# Patient Record
Sex: Male | Born: 1995 | Race: White | Hispanic: No | Smoking: Never smoker
Health system: Southern US, Community
[De-identification: ages and names within clinical notes are randomized; demographics above are authoritative.]

---
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
## 2016-02-02 ENCOUNTER — Ambulatory Visit (INDEPENDENT_AMBULATORY_CARE_PROVIDER_SITE_OTHER): Payer: Commercial Managed Care - HMO | Admitting: Family Medicine

## 2016-02-02 VITALS — BP 132/86 | HR 102 | Temp 102.6°F | Ht 73.0 in | Wt 315.0 lb

## 2016-02-02 DIAGNOSIS — R059 Cough, unspecified: Secondary | ICD-10-CM

## 2016-02-02 DIAGNOSIS — J101 Influenza due to other identified influenza virus with other respiratory manifestations: Secondary | ICD-10-CM | POA: Diagnosis not present

## 2016-02-02 DIAGNOSIS — R05 Cough: Secondary | ICD-10-CM | POA: Diagnosis not present

## 2016-02-02 MED ORDER — OSELTAMIVIR PHOSPHATE 75 MG PO CAPS: 75.0000 mg | ORAL_CAPSULE | Freq: Two times a day (BID) | ORAL | Status: DC

## 2016-02-02 MED ORDER — GUAIFENESIN-CODEINE 100-10 MG/5ML PO SYRP: 5.0000 mL | ORAL_SOLUTION | ORAL | Status: DC | PRN

## 2016-02-02 NOTE — Progress Notes (Signed)
Subjective:  Patient ID: Austin Ford, male    DOB: 05/31/1996  Age: 20 y.o. MRN: [id number]  CC: Abdominal Cramping   HPI Austin Ford presents for  Patient presents with severe paroxysms of dry cough and runny stuffy nose. Diffuse headache of moderate intensity. Patient also has chills and subjective fever. Body aches worst in the back but present in the legs, shoulders, and torso as well. Onset 2 days ago. Also having some nausea with the above symptoms. Sister diagnosed with influenza B    History Austin Ford has no past medical history on file.   He has no past surgical history on file.   His family history includes Breast cancer in his maternal grandmother; Heart disease in his paternal grandmother; Hyperlipidemia in his paternal grandfather; Hypertension in his father, mother, and paternal grandmother; Lymphoma in his paternal grandfather.He reports that he has never smoked. He has never used smokeless tobacco. He reports that he does not drink alcohol or use illicit drugs.    ROS Review of Systems  Constitutional: Positive for fever, chills, activity change and appetite change.  HENT: Negative for congestion, ear discharge, ear pain, hearing loss, nosebleeds, postnasal drip, rhinorrhea, sinus pressure, sneezing and trouble swallowing.   Respiratory: Positive for cough. Negative for chest tightness and shortness of breath.   Cardiovascular: Negative for chest pain and palpitations.  Musculoskeletal: Positive for myalgias.  Skin: Negative for color change and rash.    Objective:  BP 132/86 mmHg  Pulse 102  Temp(Src) 102.6 F (39.2 C) (Oral)  Ht 6\' 1"  (1.854 m)  Wt 315 lb (142.883 kg)  BMI 41.57 kg/m2  BP Readings from Last 3 Encounters:  02/02/16 132/86  04/04/15 138/78  03/28/15 146/92    Wt Readings from Last 3 Encounters:  02/02/16 315 lb (142.883 kg) (100 %*, Z = 3.18)  04/04/15 295 lb 8 oz (134.038 kg) (100 %*, Z = 3.05)  03/28/15 299 lb 9.6 oz (135.898 kg) (100  %*, Z = 3.09)   * Growth percentiles are based on CDC 2-20 Years data.     Physical Exam  Constitutional: He is oriented to person, place, and time. He appears well-developed and well-nourished.  HENT:  Head: Normocephalic and atraumatic.  Right Ear: Tympanic membrane and external ear normal. No decreased hearing is noted.  Left Ear: Tympanic membrane and external ear normal. No decreased hearing is noted.  Nose: Mucosal edema present. Right sinus exhibits no frontal sinus tenderness. Left sinus exhibits no frontal sinus tenderness.  Mouth/Throat: No oropharyngeal exudate or posterior oropharyngeal erythema.  Eyes: EOM are normal. Pupils are equal, round, and reactive to light.  Neck: No Brudzinski's sign noted.  Pulmonary/Chest: Breath sounds normal. No respiratory distress. He has no wheezes. He has no rales.  Abdominal: Soft. There is no tenderness.  Lymphadenopathy:       Head (right side): No preauricular adenopathy present.       Head (left side): No preauricular adenopathy present.       Right cervical: No superficial cervical adenopathy present.      Left cervical: No superficial cervical adenopathy present.  Neurological: He is alert and oriented to person, place, and time.  Skin: Skin is warm and dry.     No results found for: WBC, HGB, HCT, PLT, GLUCOSE, CHOL, TRIG, HDL, LDLDIRECT, LDLCALC, ALT, AST, NA, K, CL, CREATININE, BUN, CO2, TSH, PSA, INR, GLUF, HGBA1C, MICROALBUR  Patient was never admitted.  Assessment & Plan:   Austin Ford was seen  today for abdominal cramping.  Diagnoses and all orders for this visit:  Cough -     Veritor Flu A/B Waived  Other orders -     oseltamivir (TAMIFLU) 75 MG capsule; Take 1 capsule (75 mg total) by mouth 2 (two) times daily. -     guaiFENesin-codeine (CHERATUSSIN AC) 100-10 MG/5ML syrup; Take 5 mLs by mouth every 4 (four) hours as needed for cough.  Flu test positive for influenza B  I have discontinued Austin Ford  nystatin-triamcinolone and HYDROcodone-acetaminophen. I am also having him start on oseltamivir and guaiFENesin-codeine.  Meds ordered this encounter  Medications  . oseltamivir (TAMIFLU) 75 MG capsule    Sig: Take 1 capsule (75 mg total) by mouth 2 (two) times daily.    Dispense:  10 capsule    Refill:  0  . guaiFENesin-codeine (CHERATUSSIN AC) 100-10 MG/5ML syrup    Sig: Take 5 mLs by mouth every 4 (four) hours as needed for cough.    Dispense:  180 mL    Refill:  0     Follow-up: No Follow-up on file.  Mechele Claude, M.D.

## 2016-02-03 LAB — VERITOR FLU A/B WAIVED
Influenza A: NEGATIVE
Influenza B: POSITIVE — AB

## 2016-02-21 ENCOUNTER — Encounter: Payer: Self-pay | Admitting: Nurse Practitioner

## 2016-02-21 ENCOUNTER — Ambulatory Visit (INDEPENDENT_AMBULATORY_CARE_PROVIDER_SITE_OTHER): Payer: Commercial Managed Care - HMO | Admitting: Nurse Practitioner

## 2016-02-21 VITALS — BP 132/84 | HR 67 | Temp 97.8°F | Ht 73.0 in | Wt 308.6 lb

## 2016-02-21 DIAGNOSIS — R0602 Shortness of breath: Secondary | ICD-10-CM

## 2016-02-21 DIAGNOSIS — Z0000 Encounter for general adult medical examination without abnormal findings: Secondary | ICD-10-CM | POA: Diagnosis not present

## 2016-02-21 NOTE — Patient Instructions (Signed)

## 2016-02-21 NOTE — Progress Notes (Signed)
   Subjective:    Patient ID: Austin Ford, male    DOB: 1995/11/29, 20 y.o.   MRN: [id number]    Pt in today for CPE and has complaint of some shortness of breath. Has never been diagnosed with asthma or had any problems with SOB before. Had the flu in March along with cough and some hoarseness and does not know if it is related. Is currently taking Zyrtec, Mucinex, and Ibuprofen.   Shortness of Breath This is a recurrent problem. The current episode started more than 1 month ago. The problem occurs intermittently. The problem has been waxing and waning. Associated symptoms include a sore throat. Nothing aggravates the symptoms. The patient has no known risk factors for DVT/PE. His past medical history is significant for allergies.  Knee Pain  The incident occurred more than 1 week ago. The incident occurred at home. The injury mechanism was a twisting injury. The pain is present in the left knee. The patient is experiencing no pain. The pain has been intermittent since onset. He reports no foreign bodies present. The symptoms are aggravated by weight bearing. He has tried NSAIDs for the symptoms. The treatment provided significant relief.    Review of Systems  Constitutional: Negative.   HENT: Positive for congestion, postnasal drip, sinus pressure, sore throat and voice change.   Eyes: Negative.   Respiratory: Positive for shortness of breath.   Cardiovascular: Negative.   Gastrointestinal: Negative.   Endocrine: Negative.   Genitourinary: Negative.   Musculoskeletal: Negative.   Skin: Negative.   Allergic/Immunologic: Positive for environmental allergies.  Neurological: Negative.   Hematological: Negative.   Psychiatric/Behavioral: Negative.        Objective:   Physical Exam  Constitutional: He is oriented to person, place, and time. Vital signs are normal. He appears well-developed and well-nourished.  HENT:  Right Ear: Hearing, tympanic membrane, external ear and ear canal  normal.  Left Ear: Hearing, tympanic membrane, external ear and ear canal normal.  Nose: Nose normal.  Mouth/Throat: Uvula is midline, oropharynx is clear and moist and mucous membranes are normal.  Enlarged tonsils  Eyes: Conjunctivae and EOM are normal. Pupils are equal, round, and reactive to light.  Neck: Trachea normal and normal range of motion. Neck supple.  Cardiovascular: Normal rate, regular rhythm, normal heart sounds and intact distal pulses.   Pulmonary/Chest: Effort normal and breath sounds normal.  Abdominal: Soft. Bowel sounds are normal.  Musculoskeletal: Normal range of motion.  Neurological: He is alert and oriented to person, place, and time. He has normal strength.  Skin: Skin is warm, dry and intact.  Psychiatric: He has a normal mood and affect. His speech is normal and behavior is normal. Judgment and thought content normal. Cognition and memory are normal.  Vitals reviewed.   BP 132/84 mmHg  Pulse 67  Temp(Src) 97.8 F (36.6 C) (Oral)  Ht '6\' 1"'$  (1.854 m)  Wt 308 lb 9.6 oz (139.98 kg)  BMI 40.72 kg/m2     Assessment & Plan:   1. Shortness of breath RTO if worsens  2. Annual physical exam - CMP14+EGFR - Lipid panel   Labs pending Health Maintenance reviewed Diet and exercise encouraged RTO Annually or PRN Rosalio Loud FNP Student  Mary-Margaret Hassell Done, FNP

## 2016-02-22 LAB — CMP14+EGFR
ALBUMIN: 4.5 g/dL (ref 3.5–5.5)
ALT: 24 IU/L (ref 0–44)
AST: 20 IU/L (ref 0–40)
Albumin/Globulin Ratio: 1.7 (ref 1.2–2.2)
Alkaline Phosphatase: 87 IU/L (ref 56–127)
BUN / CREAT RATIO: 10 (ref 9–20)
BUN: 10 mg/dL (ref 6–20)
Bilirubin Total: 0.5 mg/dL (ref 0.0–1.2)
CALCIUM: 9.4 mg/dL (ref 8.7–10.2)
CO2: 24 mmol/L (ref 18–29)
CREATININE: 0.99 mg/dL (ref 0.76–1.27)
Chloride: 102 mmol/L (ref 96–106)
GFR, EST AFRICAN AMERICAN: 128 mL/min/{1.73_m2} (ref >59)
GFR, EST NON AFRICAN AMERICAN: 111 mL/min/{1.73_m2} (ref >59)
GLOBULIN, TOTAL: 2.6 g/dL (ref 1.5–4.5)
Glucose: 87 mg/dL (ref 65–99)
Potassium: 4.2 mmol/L (ref 3.5–5.2)
SODIUM: 142 mmol/L (ref 134–144)
Total Protein: 7.1 g/dL (ref 6.0–8.5)

## 2016-02-22 LAB — LIPID PANEL
CHOLESTEROL TOTAL: 113 mg/dL (ref 100–169)
Chol/HDL Ratio: 3.4 ratio units (ref 0.0–5.0)
HDL: 33 mg/dL — ABNORMAL LOW (ref >39)
LDL Calculated: 47 mg/dL (ref 0–109)
TRIGLYCERIDES: 165 mg/dL — AB (ref 0–89)
VLDL Cholesterol Cal: 33 mg/dL (ref 5–40)

## 2016-03-02 IMAGING — CR DG KNEE COMPLETE 4+V*L*
4 series · 4 of 4 positions shown · non-contrast
Comparison: None.

CLINICAL DATA: Left knee pain following fall

EXAM:
LEFT KNEE - COMPLETE 4+ VIEW

[AP]
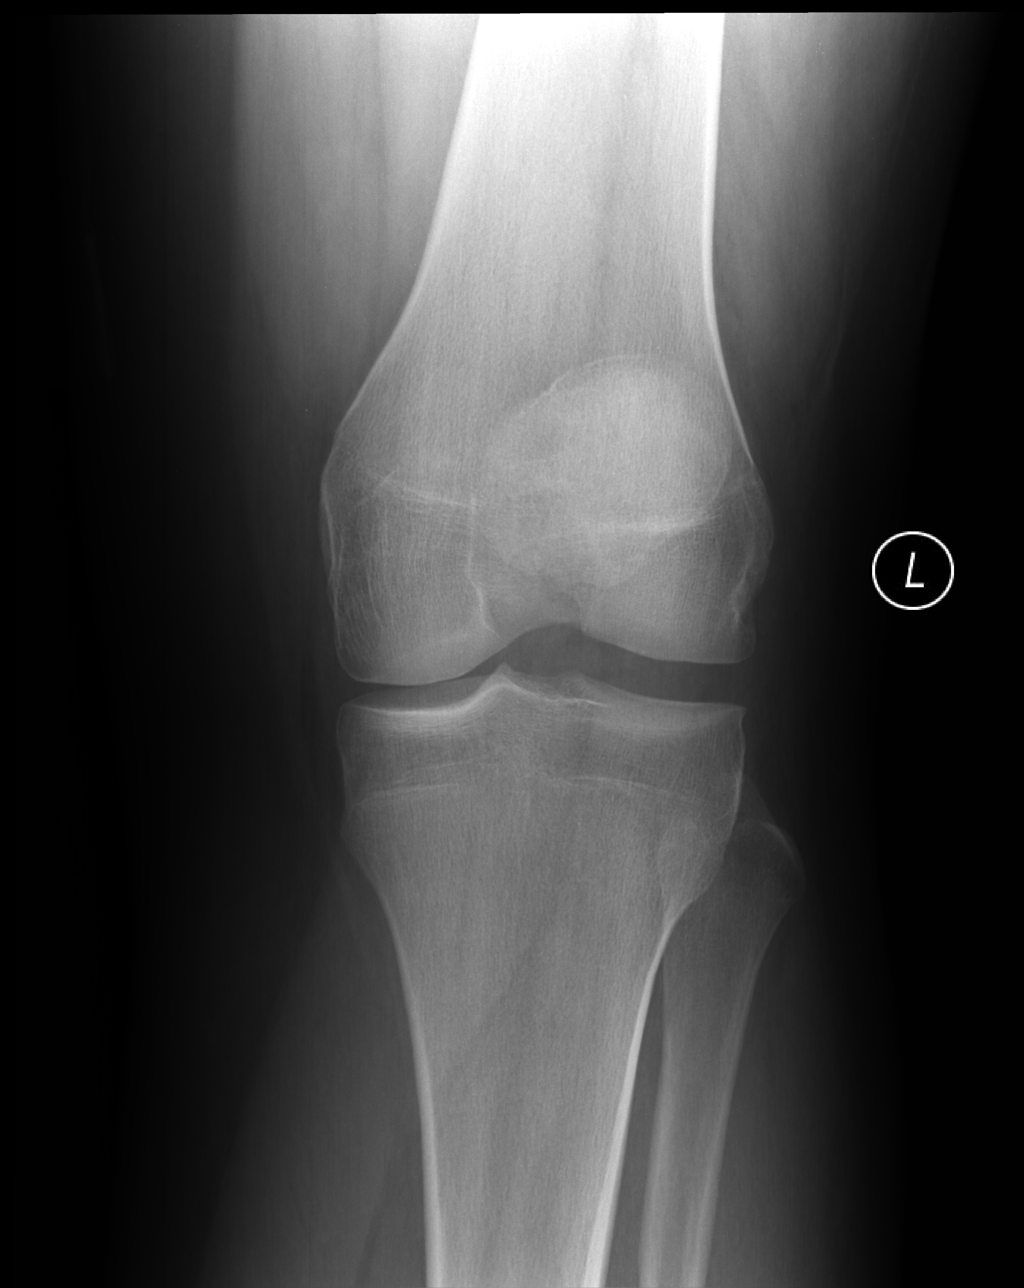

[ap axial]
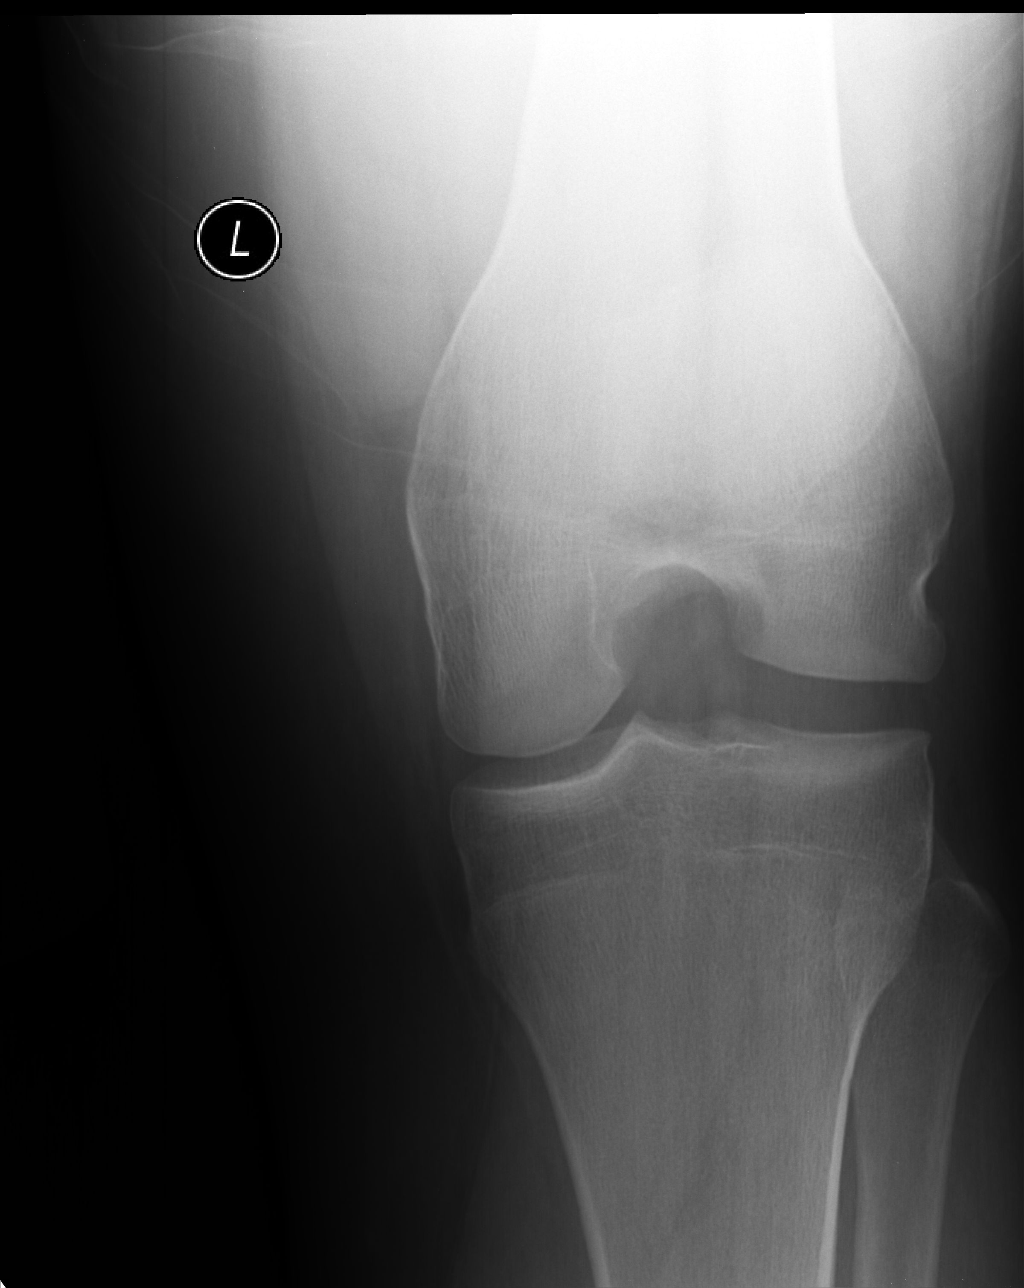

[lateral]
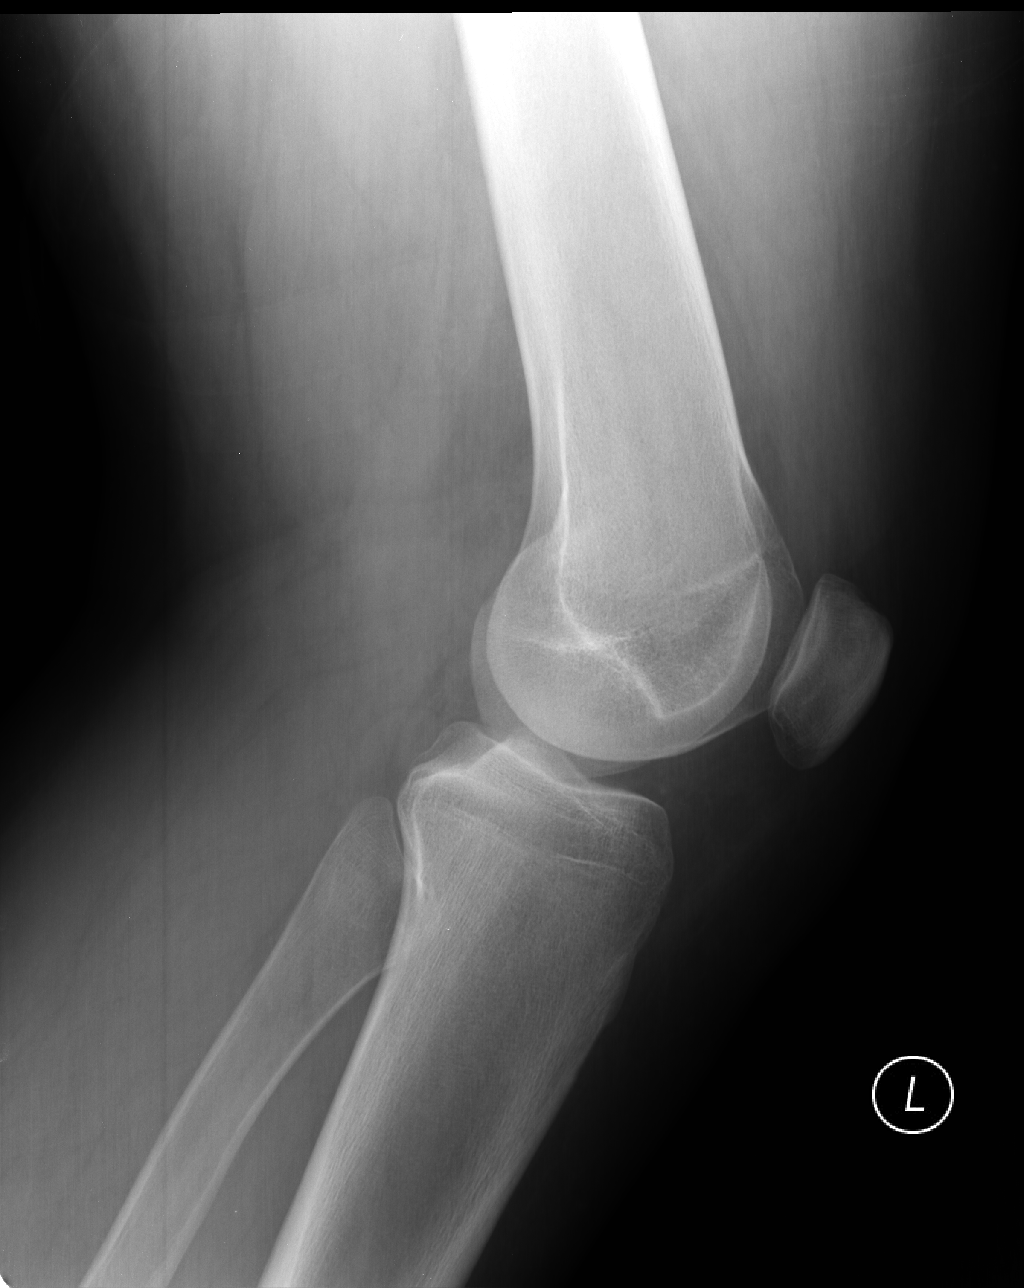

[sunrise]
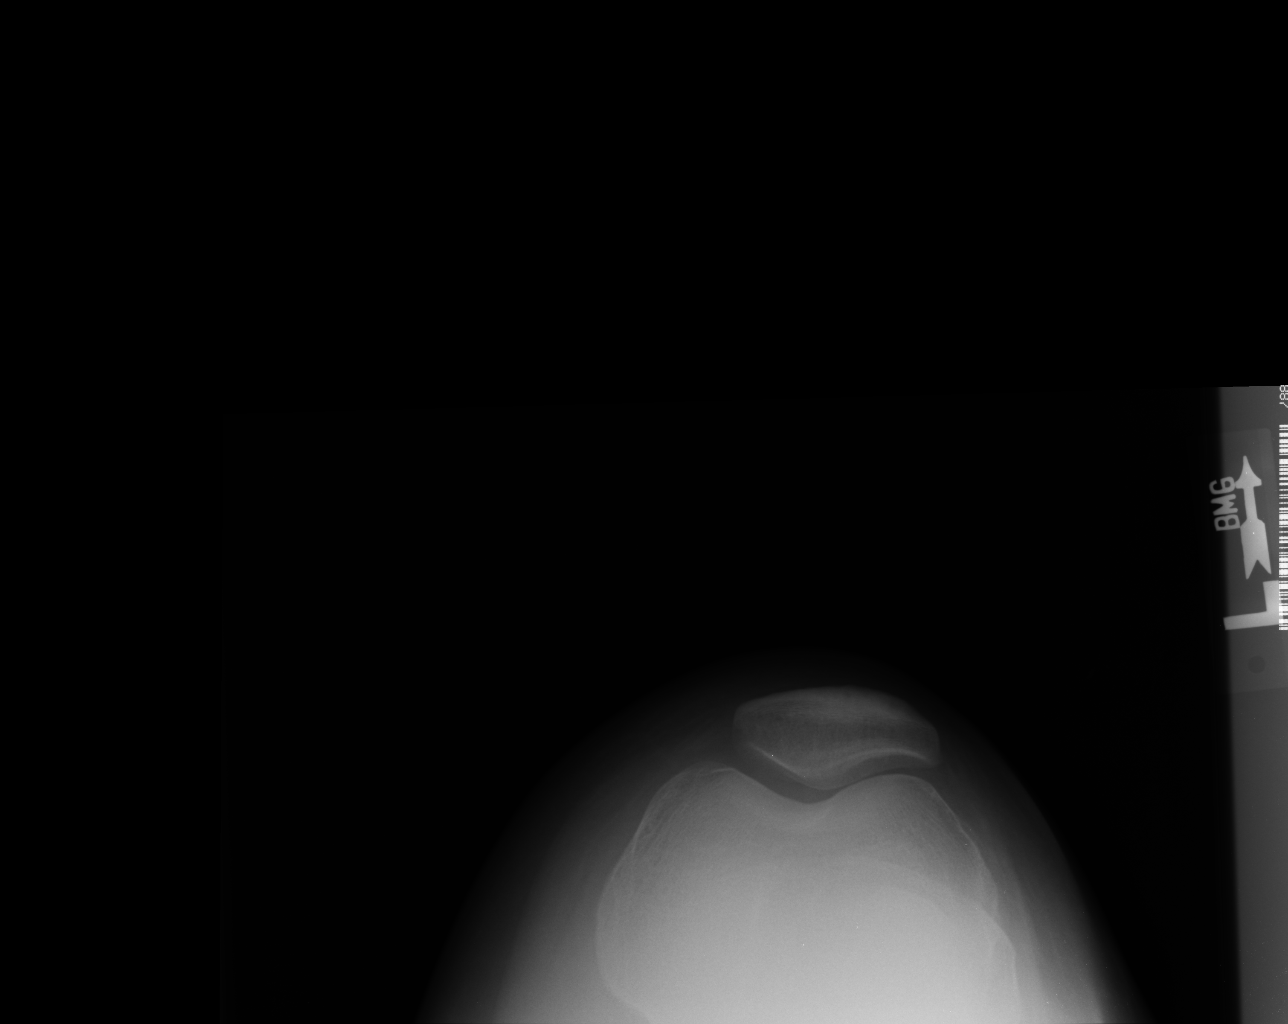

[4 of 4 positions shown; findings below may reference images not displayed]

FINDINGS: There is no evidence of fracture, dislocation, or joint effusion.
There is no evidence of arthropathy or other focal bone abnormality.
Soft tissues are unremarkable.
IMPRESSION: No acute abnormality noted.

## 2016-07-18 ENCOUNTER — Ambulatory Visit (INDEPENDENT_AMBULATORY_CARE_PROVIDER_SITE_OTHER): Payer: Commercial Managed Care - HMO | Admitting: Nurse Practitioner

## 2016-07-18 ENCOUNTER — Encounter: Payer: Self-pay | Admitting: Nurse Practitioner

## 2016-07-18 VITALS — BP 133/78 | HR 86 | Temp 97.0°F | Ht 73.0 in | Wt 299.0 lb

## 2016-07-18 DIAGNOSIS — L259 Unspecified contact dermatitis, unspecified cause: Secondary | ICD-10-CM | POA: Diagnosis not present

## 2016-07-18 MED ORDER — CLOTRIMAZOLE-BETAMETHASONE 1-0.05 % EX CREA: 1.0000 "application " | TOPICAL_CREAM | Freq: Two times a day (BID) | CUTANEOUS | 3 refills | Status: DC

## 2016-07-18 MED ORDER — METHYLPREDNISOLONE ACETATE 80 MG/ML IJ SUSP
80.0000 mg | Freq: Once | INTRAMUSCULAR | Status: AC
  Administered 2016-07-18: 80 mg via INTRAMUSCULAR

## 2016-07-18 NOTE — Progress Notes (Signed)
   Subjective:    Patient ID: Austin Ford, male    DOB: 1995-11-21, 20 y.o.   MRN: [id number]  HPI Patient presents to the clinic today with a complain of rash on hands bilaterally. He had this symptom sometime ago and was treated with anti-fungi cream, it went away but came again last year and it is getting worse. Has tried using Eucerin cream with no effect. He does not participate in any communal sports, and does not have any known allergy to food or any substance. He his a full time college student, but does not live in the dorm. 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 He started this job 6 months ago.    Review of Systems  Constitutional: Negative for chills, fatigue and fever.  Respiratory: Negative for chest tightness, shortness of breath and wheezing.   Cardiovascular: Negative for chest pain and palpitations.  Allergic/Immunologic: Negative for environmental allergies and food allergies.  Psychiatric/Behavioral: Negative.        Objective:   Physical Exam  Constitutional: He appears well-developed and well-nourished.  Pulmonary/Chest: Effort normal and breath sounds normal.  Skin: Skin is warm and dry. Rash noted. Erythema: small erythmatous pruritic vesicles noted on wrist and dorsal part his hands and fingers bilaterally   scattered acne-like papules below the axilla billaterally    BP 133/78   Pulse 86   Temp 97 F (36.1 C) (Oral)   Ht 6\' 1"  (1.854 m)   Wt 299 lb (135.6 kg)   BMI 39.45 kg/m         Assessment & Plan:  1. Contact dermatitis Use latex free gloves if you need to use gloves Apply A&D ointment to skin as needed for dryness - methylPREDNISolone acetate (DEPO-MEDROL) injection 80 mg; Inject 1 mL (80 mg total) into the muscle once. - clotrimazole-betamethasone (LOTRISONE) cream; Apply 1 application topically 2 (two) times daily.  Dispense: 45 g;  Refill: 3   Use med as prescribed Follow up PRN, if symptom worsens or does not improve  Mary-Margaret Daphine DeutscherMartin, FNP

## 2016-07-18 NOTE — Patient Instructions (Signed)
Contact Dermatitis Dermatitis is redness, soreness, and swelling (inflammation) of the skin. Contact dermatitis is a reaction to certain substances that touch the skin. There are two types of contact dermatitis:   Irritant contact dermatitis. This type is caused by something that irritates your skin, such as dry hands from washing them too much. This type does not require previous exposure to the substance for a reaction to occur. This type is more common.  Allergic contact dermatitis. This type is caused by a substance that you are allergic to, such as a nickel allergy or poison ivy. This type only occurs if you have been exposed to the substance (allergen) before. Upon a repeat exposure, your body reacts to the substance. This type is less common. CAUSES  Many different substances can cause contact dermatitis. Irritant contact dermatitis is most commonly caused by exposure to:   Makeup.   Soaps.   Detergents.   Bleaches.   Acids.   Metal salts, such as nickel.  Allergic contact dermatitis is most commonly caused by exposure to:   Poisonous plants.   Chemicals.   Jewelry.   Latex.   Medicines.   Preservatives in products, such as clothing.  RISK FACTORS This condition is more likely to develop in:   People who have jobs that expose them to irritants or allergens.  People who have certain medical conditions, such as asthma or eczema.  SYMPTOMS  Symptoms of this condition may occur anywhere on your body where the irritant has touched you or is touched by you. Symptoms include:  Dryness or flaking.   Redness.   Cracks.   Itching.   Pain or a burning feeling.   Blisters.  Drainage of small amounts of blood or clear fluid from skin cracks. With allergic contact dermatitis, there may also be swelling in areas such as the eyelids, mouth, or genitals.  DIAGNOSIS  This condition is diagnosed with a medical history and physical exam. A patch skin test  may be performed to help determine the cause. If the condition is related to your job, you may need to see an occupational medicine specialist. TREATMENT Treatment for this condition includes figuring out what caused the reaction and protecting your skin from further contact. Treatment may also include:   Steroid creams or ointments. Oral steroid medicines may be needed in more severe cases.  Antibiotics or antibacterial ointments, if a skin infection is present.  Antihistamine lotion or an antihistamine taken by mouth to ease itching.  A bandage (dressing). HOME CARE INSTRUCTIONS Skin Care  Moisturize your skin as needed.   Apply cool compresses to the affected areas.  Try taking a bath with:  Epsom salts. Follow the instructions on the packaging. You can get these at your local pharmacy or grocery store.  Baking soda. Pour a small amount into the bath as directed by your health care provider.  Colloidal oatmeal. Follow the instructions on the packaging. You can get this at your local pharmacy or grocery store.  Try applying baking soda paste to your skin. Stir water into baking soda until it reaches a paste-like consistency.  Do not scratch your skin.  Bathe less frequently, such as every other day.  Bathe in lukewarm water. Avoid using hot water. Medicines  Take or apply over-the-counter and prescription medicines only as told by your health care provider.   If you were prescribed an antibiotic medicine, take or apply your antibiotic as told by your health care provider. Do not stop using the   antibiotic even if your condition starts to improve. General Instructions  Keep all follow-up visits as told by your health care provider. This is important.  Avoid the substance that caused your reaction. If you do not know what caused it, keep a journal to try to track what caused it. Write down:  What you eat.  What cosmetic products you use.  What you drink.  What  you wear in the affected area. This includes jewelry.  If you were given a dressing, take care of it as told by your health care provider. This includes when to change and remove it. SEEK MEDICAL CARE IF:   Your condition does not improve with treatment.  Your condition gets worse.  You have signs of infection such as swelling, tenderness, redness, soreness, or warmth in the affected area.  You have a fever.  You have new symptoms. SEEK IMMEDIATE MEDICAL CARE IF:   You have a severe headache, neck pain, or neck stiffness.  You vomit.  You feel very sleepy.  You notice red streaks coming from the affected area.  Your bone or joint underneath the affected area becomes painful after the skin has healed.  The affected area turns darker.  You have difficulty breathing.   This information is not intended to replace advice given to you by your health care provider. Make sure you discuss any questions you have with your health care provider.   Document Released: 10/20/2000 Document Revised: 07/14/2015 Document Reviewed: 03/10/2015 Elsevier Interactive Patient Education 2016 [employer]  

## 2017-09-14 ENCOUNTER — Ambulatory Visit: Payer: 59 | Admitting: Physician Assistant

## 2017-09-14 ENCOUNTER — Encounter: Payer: Self-pay | Admitting: Physician Assistant

## 2017-09-14 ENCOUNTER — Other Ambulatory Visit: Payer: Self-pay

## 2017-09-14 DIAGNOSIS — Z9109 Other allergy status, other than to drugs and biological substances: Secondary | ICD-10-CM

## 2017-09-14 DIAGNOSIS — L309 Dermatitis, unspecified: Secondary | ICD-10-CM | POA: Insufficient documentation

## 2017-09-14 MED ORDER — TRIAMCINOLONE ACETONIDE 0.1 % EX CREA: 1.0000 "application " | TOPICAL_CREAM | Freq: Two times a day (BID) | CUTANEOUS | 1 refills | Status: AC

## 2017-09-14 NOTE — Patient Instructions (Addendum)
Drink at least 64 ounces of water daily. Consider a humidifier for the room where you sleep. Bathe once daily. Avoid using HOT water, as it dries skin. Avoid deodorant soaps (Dial is the worst!) and stick with gentle cleansers (I like Cetaphil Liquid Cleanser). After bathing, dry off completely, then apply a thick emollient cream (I like Cetaphil Moisturizing Cream, Eucerin is also good). Apply the cream twice daily, or more!    IF you received an x-ray today, you will receive an invoice from [facility]. Please contact [facility] at [phone] with questions or concerns regarding your invoice.   IF you received labwork today, you will receive an invoice from [facility]. Please contact [facility] at [phone] with questions or concerns regarding your invoice.   Our billing staff will not be able to assist you with questions regarding bills from these companies.  You will be contacted with the lab results as soon as they are available. The fastest way to get your results is to activate your My Chart account. Instructions are located on the last page of this paperwork. If you have not heard from us regarding the results in 2 weeks, please contact this office.

## 2017-09-14 NOTE — Progress Notes (Signed)
Patient ID: Austin Ford Casebier, male    DOB: August 03, 1996, 21 y.o.   MRN: [id number]  PCP: Mechele ClaudeStacks, Warren, MD  Chief Complaint  Patient presents with  . Rash    around left eye, arms, itchy, started last night    Subjective:   Presents for evaluation of itchy rash on both forearms and left face.  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  Most recently treated at his primary care office in 07/26/2016 with Lotrisone cream.  [place] [employer], studying for a test, when a fellow student told him it looked like he had a black eye. His mother later told him the eye looked swollen, and he took a dose of oral Benadryl. This morning the swelling around his eye seemed better though not resolved.  He also discovered small red itchy bumps on the inner aspect of both forearms.  No new lesions on his hands.  No lesions on the upper arms trunk or legs.  He has a small cluster of itchy red bumps next to the left nose.  He describes the skin around his left eye feeling dry.  He takes cetirizine daily for treatment of environmental allergies. No new products, pets, medications, recent travel.  Taking ibuprofen and acetaminophen for hip pain x 1 week. Good relief.   Review of Systems As above.    Patient Active Problem List   Diagnosis Date Noted  . Shortness of breath 02/21/2016     Prior to Admission medications   Medication Sig Start Date End Date Taking? Authorizing Provider  clotrimazole-betamethasone (LOTRISONE) cream Apply 1 application topically 2 (two) times daily. 07/18/16  Yes Daphine DeutscherMartin, Mary-Margaret, FNP     No Known Allergies     Objective:  Physical Exam  Constitutional: He is oriented to person, place, and time. He appears well-developed and well-nourished. He is active and cooperative. No distress.  BP 118/82   Pulse 69   Temp 99.6 F (37.6 Ford) (Oral)   Resp 16   Ht 6\' 1"  (1.854 m)   Wt (!) 322 lb (146.1 kg)   SpO2 98%   BMI 42.48  kg/m   HENT:  Head: Normocephalic and atraumatic.    Right Ear: Hearing normal.  Left Ear: Hearing normal.  Eyes: Conjunctivae are normal. No scleral icterus.  Neck: Normal range of motion. Neck supple. No thyromegaly present.  Cardiovascular: Normal rate, regular rhythm and normal heart sounds.  Pulses:      Radial pulses are 2+ on the right side, and 2+ on the left side.  Pulmonary/Chest: Effort normal and breath sounds normal.  Lymphadenopathy:       Head (right side): No tonsillar, no preauricular, no posterior auricular and no occipital adenopathy present.       Head (left side): No tonsillar, no preauricular, no posterior auricular and no occipital adenopathy present.    He has no cervical adenopathy.       Right: No supraclavicular adenopathy present.       Left: No supraclavicular adenopathy present.  Neurological: He is alert and oriented to person, place, and time. No sensory deficit.  Skin: Skin is warm, dry and intact. Rash noted. Rash is papular. No cyanosis or erythema. Nails show no clubbing.     Psychiatric: He has a normal mood and affect. His speech is normal and behavior is normal.           Assessment & Plan:   1. Environmental allergies  2. Dermatitis Unclear etiology though he may be an atopic person in general.  Continue cetirizine every day and supplement with diphenhydramine at at bedtime as needed.  Triamcinolone cream to the new lesions twice daily for up to 7 days.  May repeat course with 1 week off if needed.  Dry skin care reviewed. - triamcinolone cream (KENALOG) 0.1 %; Apply 1 application 2 (two) times daily topically.  Dispense: 30 g; Refill: 1    Return if symptoms worsen or fail to improve.   Fernande Brashelle S. Elfriede Bonini, PA-[facility]

## 2017-09-18 ENCOUNTER — Ambulatory Visit: Payer: 59 | Admitting: Family Medicine

## 2017-09-18 ENCOUNTER — Encounter: Payer: Self-pay | Admitting: Family Medicine

## 2017-09-18 VITALS — BP 128/77 | HR 84 | Temp 98.3°F | Ht 73.0 in | Wt 321.6 lb

## 2017-09-18 DIAGNOSIS — L249 Irritant contact dermatitis, unspecified cause: Secondary | ICD-10-CM | POA: Diagnosis not present

## 2017-09-18 DIAGNOSIS — R21 Rash and other nonspecific skin eruption: Secondary | ICD-10-CM

## 2017-09-18 MED ORDER — TRIAMCINOLONE ACETONIDE 40 MG/ML IJ SUSP
40.0000 mg | Freq: Once | INTRAMUSCULAR | Status: AC
  Administered 2017-09-18: 40 mg via INTRAMUSCULAR

## 2017-09-18 MED ORDER — MOMETASONE FUROATE 0.1 % EX CREA: 1.0000 "application " | TOPICAL_CREAM | Freq: Every day | CUTANEOUS | 0 refills | Status: AC

## 2017-09-18 NOTE — Progress Notes (Signed)
   HPI  Patient presents today here with rash.  Patient explains his previous rash that appeared to look like a black eye.  He was seen at urgent care for this and has improved.  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 Is a similar rash in his right waistline. Had a long-term rash on his right dorsal hand that does respond well to Lotrisone  His brother has severe eczema  PMH: Smoking status noted ROS: Per HPI  Objective: BP 128/77   Pulse 84   Temp 98.3 F (36.8 C) (Oral)   Ht 6\' 1"  (1.854 m)   Wt (!) 321 lb 9.6 oz (145.9 kg)   BMI 42.43 kg/m  Gen: NAD, alert, cooperative with exam HEENT: NCAT, EOMI, PERRL CV: RRR, good S1/S2, no murmur Resp: CTABL, no wheezes, non-labored Ext: No edema, warm Neuro: Alert and oriented, No gross deficits Skin:  Right arm with vesicular and papular erythematous lesions , also present right waistline and one lesion on the posterior neck Right dorsal hand with faint slightly scaled lesions with diffuse borders   Assessment and plan:  #Contact dermatitis Likely patient has atopic dermatitis at baseline and has had some inflammatory reaction to something he is working with. Given IM Kenalog today. Also increased potency of steroid for right arm lesion and right dorsal hand lesion   Rash Right dorsal hand with long-term rash responding to steroid ointment, he is concerned this could be psoriasis, however his family history of eczema and it appears to be eczema on exam. Refer to dermatology for long-term treatment   Orders Placed This Encounter  Procedures  . Ambulatory referral to Dermatology    Referral Priority:   Routine    Referral Type:   Consultation    Referral Reason:   Specialty Services Required    Requested Specialty:   Dermatology    Number of Visits Requested:   1    Meds ordered  this encounter  Medications  . triamcinolone acetonide (KENALOG-40) injection 40 mg  . mometasone (ELOCON) 0.1 % cream    Sig: Apply 1 application daily topically.    Dispense:  45 g    Refill:  0    Murtis SinkSam Bradshaw, MD [facility] 09/18/2017, 8:31 AM

## 2017-09-18 NOTE — Patient Instructions (Signed)
Great to meet you!
